# Patient Record
Sex: Male | Born: 1960 | Race: White | Hispanic: No | Marital: Married | Smoking: Never smoker
Health system: Southern US, Community
[De-identification: ages and names within clinical notes are randomized; demographics above are authoritative.]

## PROBLEM LIST (undated history)

## (undated) DIAGNOSIS — T7840XA Allergy, unspecified, initial encounter: Secondary | ICD-10-CM

## (undated) DIAGNOSIS — M1990 Unspecified osteoarthritis, unspecified site: Secondary | ICD-10-CM

## (undated) HISTORY — DX: Unspecified osteoarthritis, unspecified site: M19.90

## (undated) HISTORY — DX: Allergy, unspecified, initial encounter: T78.40XA

## (undated) HISTORY — PX: CHOLECYSTECTOMY: SHX55

## (undated) HISTORY — PX: MENISCUS REPAIR: SHX5179

---

## 2014-12-22 ENCOUNTER — Ambulatory Visit (INDEPENDENT_AMBULATORY_CARE_PROVIDER_SITE_OTHER): Admitting: Family Medicine

## 2014-12-22 ENCOUNTER — Ambulatory Visit

## 2014-12-22 VITALS — BP 128/76 | HR 62 | Temp 98.5°F | Resp 16 | Ht 70.0 in | Wt 329.0 lb

## 2014-12-22 DIAGNOSIS — S50311A Abrasion of right elbow, initial encounter: Secondary | ICD-10-CM

## 2014-12-22 DIAGNOSIS — S80211A Abrasion, right knee, initial encounter: Secondary | ICD-10-CM

## 2014-12-22 DIAGNOSIS — S5001XA Contusion of right elbow, initial encounter: Secondary | ICD-10-CM

## 2014-12-22 DIAGNOSIS — S8001XA Contusion of right knee, initial encounter: Secondary | ICD-10-CM

## 2014-12-22 MED ORDER — HYDROCODONE-ACETAMINOPHEN 5-325 MG PO TABS: 1.0000 | ORAL_TABLET | Freq: Four times a day (QID) | ORAL | Status: AC | PRN

## 2014-12-22 MED ORDER — DICLOFENAC SODIUM 75 MG PO TBEC: 75.0000 mg | DELAYED_RELEASE_TABLET | Freq: Two times a day (BID) | ORAL | Status: AC

## 2014-12-22 NOTE — Progress Notes (Addendum)
Subjective:  This chart was scribed for Elvina SidleKurt Lauenstein, MD by Broadus Johnawaa Al Rifaie, Medical Scribe. This patient was seen in Room 1 and the patient's care was started at 5:41 PM.   Patient ID: Colin Ferrell, male    DOB: Feb 06, 1961, 54 y.o.   MRN: [id number]  Chief Complaint  Patient presents with   Knee Injury    right, x this morning, Workers Comp    Joint Swelling    right elbow, bump, Workers Comp     HPI HPI Comments: Colin Ferrell is a 54 y.o. male who presents to [facility] who is a W/C pt complaining of a right knee injury, onset this morning. Pt states that he was walking, stepped on an object and fell down that caused an abrasion on the knee and on the right elbow. His symptoms are also accompanied with mild swelling of both joints. Pt notes that he was able to walk with a mild limp, and indicates that he did feel a pop of the knee after initial onset. 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 Pt denies hitting the head.    There are no active problems to display for this patient.  Past Medical History  Diagnosis Date   Allergy    Arthritis    Past Surgical History  Procedure Laterality Date   Cholecystectomy     Meniscus repair      rt. knee   Allergies  Allergen Reactions   Codeine     Stomach ache    Prior to Admission medications   Not on File   Social History   Social History   Marital Status: Married    Spouse Name: N/A   Number of Children: N/A   Years of Education: N/A   Occupational History   Not on file.   Social History Main Topics   Smoking status: Never Smoker    Smokeless tobacco: Never Used   Alcohol Use: No   Drug Use: No   Sexual Activity: Not on file   Other Topics Concern   Not on file   Social History Narrative   No narrative on file    Review of Systems  Musculoskeletal: Positive for joint swelling and arthralgias.  Skin: Positive for wound.       Objective:   Physical Exam  Constitutional: He is oriented to person, place, and time. He appears well-developed and well-nourished. No distress.  HENT:  Head: Normocephalic and atraumatic.  Eyes: EOM are normal. Pupils are equal, round, and reactive to light.  Neck: Neck supple.  Cardiovascular: Normal rate.   Pulmonary/Chest: Effort normal.  Musculoskeletal:  Right knee shows an abrasion over the lower patella, full range of motion, mild effusion, no ecchymosis, negative Lachman  Neurological: He is alert and oriented to person, place, and time. No cranial nerve deficit.  Skin: Skin is warm and dry.  Psychiatric: He has a normal mood and affect. His behavior is normal.  Nursing note and vitals reviewed.  [facility] reading (PRIMARY) by  Dr. Milus GlazierLauenstein:  Right knee. No acute bony abnormality although there is symptoms   BP 128/76 mmHg   Pulse 62   Temp(Src) 98.5 F (36.9 C) (Oral)   Resp 16   Ht 5\' 10"  (1.778 m)   Wt 329 lb (149.233 kg)   BMI 47.21 kg/m2   SpO2 98%     Assessment & Plan:    By  signing my name below, I, Rawaa Al Rifaie, attest that this documentation has been prepared under the direction and in the presence of Elvina Sidle, MD.  Watt Climes Rifaie, Medical Scribe. 12/22/2014.  5:49 PM.  This chart was scribed in my presence and reviewed by me personally.    ICD-9-CM ICD-10-CM   1. Knee contusion, right, initial encounter 924.11 S80.01XA DG Knee Complete 4 Views Right     HYDROcodone-acetaminophen (NORCO) 5-325 MG tablet     diclofenac (VOLTAREN) 75 MG EC tablet  2. Knee abrasion, right, initial encounter 916.0 S80.211A DG Knee Complete 4 Views Right     HYDROcodone-acetaminophen (NORCO) 5-325 MG tablet     diclofenac (VOLTAREN) 75 MG EC tablet  3. Elbow abrasion, right, initial encounter 913.0 S50.311A HYDROcodone-acetaminophen (NORCO) 5-325 MG tablet     diclofenac (VOLTAREN) 75 MG EC tablet  4. Elbow contusion, right, initial encounter 923.11 S50.01XA  HYDROcodone-acetaminophen (NORCO) 5-325 MG tablet     diclofenac (VOLTAREN) 75 MG EC tablet   Recheck on Sunday  Signed, Elvina Sidle, MD   This chart was scribed in my presence and reviewed by me personally.    ICD-9-CM ICD-10-CM   1. Knee contusion, right, initial encounter 924.11 S80.01XA DG Knee Complete 4 Views Right  2. Knee abrasion, right, initial encounter 916.0 S80.211A DG Knee Complete 4 Views Right  3. Elbow abrasion, right, initial encounter 913.0 S50.311A   4. Elbow contusion, right, initial encounter 923.11 S50.01XA      Signed, Elvina Sidle, MD

## 2014-12-25 ENCOUNTER — Ambulatory Visit (INDEPENDENT_AMBULATORY_CARE_PROVIDER_SITE_OTHER): Admitting: Family Medicine

## 2014-12-25 VITALS — BP 110/70 | HR 53 | Temp 98.1°F | Resp 20 | Ht 70.0 in | Wt 332.0 lb

## 2014-12-25 DIAGNOSIS — S8001XD Contusion of right knee, subsequent encounter: Secondary | ICD-10-CM

## 2014-12-25 DIAGNOSIS — S5001XD Contusion of right elbow, subsequent encounter: Secondary | ICD-10-CM

## 2014-12-25 NOTE — Patient Instructions (Signed)
Return Thursday between 8 and 4 PM for recheck

## 2014-12-25 NOTE — Progress Notes (Signed)
 @[facility] @  This chart was scribed for Elvina SidleKurt Lauenstein, MD by Andrew Auaven Small, ED Scribe. This patient was seen in room 4 and the patient's care was started at 8:44 AM.  Patient ID: Colin Ferrell MRN: [id number], DOB: 1960/08/09, 54 y.o. Date of Encounter: 12/25/2014, 8:39 AM  Primary Physician: Bishop DublinURNER, MIRANDA JOCELYN, MD  Chief Complaint:  Chief Complaint  Patient presents with   Follow-up    Knee injury (workers compensation)    HPI: 54 y.o. year old male with history below presents with for a workers compensation follow up. Pt was seen here 3 days ago for a fall that occurred at work injuring his right knee and right elbow. Since last visit, he still has soreness and swelling to right knee with bruising. He is able to walk but has soreness when doing this. He has popping in right knee when sitting for 15+ minutes. No complaints with elbow. 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   Past Medical History  Diagnosis Date   Allergy    Arthritis      Home Meds: Prior to Admission medications   Medication Sig Start Date End Date Taking? Authorizing Provider  diclofenac (VOLTAREN) 75 MG EC tablet Take 1 tablet (75 mg total) by mouth 2 (two) times daily. Patient not taking: Reported on 12/25/2014 12/22/14   Elvina SidleKurt Lauenstein, MD  HYDROcodone-acetaminophen [facility](NORCO) 5-325 MG tablet Take 1 tablet by mouth every 6 (six) hours as needed for moderate pain. Patient not taking: Reported on 12/25/2014 12/22/14   Elvina SidleKurt Lauenstein, MD    Allergies:  Allergies  Allergen Reactions   Codeine     Stomach ache     Social History   Social History   Marital Status: Married    Spouse Name: N/A   Number of Children: N/A   Years of Education: N/A   Occupational History   Not on file.   Social History Main Topics   Smoking status: Never Smoker    Smokeless tobacco: Never Used   Alcohol Use: No   Drug Use: No   Sexual Activity: Not on file   Other Topics Concern    Not on file   Social History Narrative     Review of Systems: Constitutional: negative for chills, fever, night sweats, weight changes, or fatigue  HEENT: negative for vision changes, hearing loss, congestion, rhinorrhea, ST, epistaxis, or sinus pressure Cardiovascular: negative for chest pain or palpitations Respiratory: negative for hemoptysis, wheezing, shortness of breath, or cough Abdominal: negative for abdominal pain, nausea, vomiting, diarrhea, or constipation Dermatological: negative for rash Neurologic: negative for headache, dizziness, or syncope All other systems reviewed and are otherwise negative with the exception to those above and in the HPI.   Physical Exam: Blood pressure 110/70, pulse 53, temperature 98.1 F (36.7 C), temperature source Oral, resp. rate 20, height 5\' 10"  (1.778 m), weight 332 lb (150.594 kg), SpO2 98 %., Body mass index is 47.64 kg/(m^2). General: Well developed, well nourished, in no acute distress. Head: Normocephalic, atraumatic, eyes without discharge, sclera non-icteric, nares are without discharge. Bilateral auditory canals clear, TM's are without perforation, pearly grey and translucent with reflective cone of light bilaterally. Oral cavity moist, posterior pharynx without exudate, erythema, peritonsillar abscess, or post nasal drip.  Neck: Supple. No thyromegaly. Full ROM. No lymphadenopathy. Lungs: Clear bilaterally to auscultation without wheezes, rales, or rhonchi. Breathing is unlabored. Heart: RRR with S1 S2. No murmurs, rubs, or gallops appreciated. Abdomen: Soft, non-tender, non-distended  with normoactive bowel sounds. No hepatomegaly. No rebound/guarding. No obvious abdominal masses. Msk:  Strength and tone normal for age. Right knee- Mild swelling, healing abrasion right knee. Full ROM.  Mild crepitous Extremities/Skin: Warm and dry. No clubbing or cyanosis. No edema. No rashes or suspicious lesions. Neuro: Alert and oriented X 3.  Moves all extremities spontaneously. Gait is normal. CNII-XII grossly in tact. Psych:  Responds to questions appropriately with a normal affect.     ASSESSMENT AND PLAN:  54 y.o. year old male with  This chart was scribed in my presence and reviewed by me personally.    ICD-9-CM ICD-10-CM   1. Elbow contusion, right, subsequent encounter V58.89 S50.01XD    923.11    2. Knee contusion, right, subsequent encounter V58.89 S80.01XD    924.11       By signing my name below, I, Raven Small, attest that this documentation has been prepared under the direction and in the presence of Elvina Sidle, MD.  Electronically Signed: Andrew Au, ED Scribe. 12/25/2014. 8:51 AM.  Signed, Elvina Sidle, MD 12/25/2014 8:39 AM

## 2014-12-29 ENCOUNTER — Ambulatory Visit (INDEPENDENT_AMBULATORY_CARE_PROVIDER_SITE_OTHER): Admitting: Family Medicine

## 2014-12-29 VITALS — BP 126/82 | HR 73 | Temp 98.4°F | Resp 18 | Ht 70.0 in | Wt 330.8 lb

## 2014-12-29 DIAGNOSIS — S8001XA Contusion of right knee, initial encounter: Secondary | ICD-10-CM | POA: Diagnosis not present

## 2014-12-29 NOTE — Progress Notes (Signed)
This chart was scribed for Elvina SidleKurt Anoop Hemmer, MD by Stann Oresung-Kai Tsai, medical scribe at [facility].The patient was seen in exam room 2 and the patient's care was started at 2:25 PM.  Patient ID: Colin BachelorMark Ferrell MRN: [id number], DOB: Jul 10, 1960, 54 y.o. Date of Encounter: 12/29/2014  Primary Physician: Bishop DublinURNER, MIRANDA JOCELYN, MD  Chief Complaint:  Chief Complaint  Patient presents with  . Follow-up    w/c follow up    HPI:  Colin BachelorMark Berkland is a 54 y.o. male who presents to [facility] for workers comp follow up.  He initially injured his right knee while at work 7 days ago. He stepped on an object and fell down that caused an abrasion on the knee and the right elbow.   His right knee still have some pain. He feels some pain in his left knee but he believes it is due to compensation. The abrasion on his right knee is mostly healed. His right elbow is fine now. He walks about 2 miles a day. He still notices some popping in his right knee. He notes that his right leg was about to give out last night.   He works for the [employer].   Allergies:  Allergies  Allergen Reactions  . Codeine     Stomach ache    Review of Systems: Constitutional: negative for fever, chills, night sweats, weight changes, or fatigue  HEENT: negative for vision changes, hearing loss, congestion, rhinorrhea, ST, epistaxis, or sinus pressure Cardiovascular: negative for chest pain or palpitations Respiratory: negative for hemoptysis, wheezing, shortness of breath, or cough Abdominal: negative for abdominal pain, nausea, vomiting, diarrhea, or constipation Dermatological: negative for rash Neurologic: negative for headache, dizziness, or syncope Musc: positive for myalgia (right knee, right elbow) All other systems reviewed and are otherwise negative with the exception to those above and in the HPI.  Physical Exam: Blood pressure 126/82, pulse 73, temperature 98.4 F (36.9 C),  temperature source Oral, resp. rate 18, height 5\' 10"  (1.778 m), weight 330 lb 12.8 oz (150.05 kg), SpO2 98 %., Body mass index is 47.46 kg/(m^2). General: Well developed, well nourished, in no acute distress. Head: Normocephalic, atraumatic, eyes without discharge, sclera non-icteric, nares are without discharge. Bilateral auditory canals clear, TM's are without perforation, pearly grey and translucent with reflective cone of light bilaterally. Oral cavity moist, posterior pharynx without exudate, erythema, peritonsillar abscess, or post nasal drip.  Neck: Supple. No thyromegaly. Full ROM. No lymphadenopathy. Lungs: Clear bilaterally to auscultation without wheezes, rales, or rhonchi. Breathing is unlabored. Heart: RRR with S1 S2. No murmurs, rubs, or gallops appreciated. Abdomen: Soft, non-tender, non-distended with normoactive bowel sounds. No hepatomegaly. No rebound/guarding. No obvious abdominal masses. Msk:  Strength and tone normal for age. Extremities/Skin: Warm and dry. No clubbing or cyanosis. Right knee with mild swelling, healing abrasion, full ROM, but with mild crepitous Neuro: Alert and oriented X 3. Moves all extremities spontaneously. Gait is normal. CNII-XII grossly in tact. Psych:  Responds to questions appropriately with a normal affect.    ASSESSMENT AND PLAN:  54 y.o. year old male with This chart was scribed in my presence and reviewed by me personally. This chart was scribed in my presence and reviewed by me personally.    ICD-9-CM ICD-10-CM   1. Knee contusion, right, initial encounter 924.11 S80.01XA      By signing my name below, I, Stann Oresung-Kai Tsai, attest that this documentation has been prepared under the direction and in  the presence of Elvina Sidle, MD. Electronically Signed: Stann Ore, Scribe. 12/29/2014 , 2:35 PM .  Signed, Elvina Sidle, MD 12/29/2014 2:35 PM

## 2016-07-04 ENCOUNTER — Other Ambulatory Visit: Payer: Self-pay | Admitting: Emergency Medicine

## 2016-07-04 ENCOUNTER — Ambulatory Visit (INDEPENDENT_AMBULATORY_CARE_PROVIDER_SITE_OTHER): Payer: Self-pay

## 2016-07-04 DIAGNOSIS — M25511 Pain in right shoulder: Secondary | ICD-10-CM

## 2016-07-04 DIAGNOSIS — M25551 Pain in right hip: Secondary | ICD-10-CM

## 2016-07-04 DIAGNOSIS — W19XXXA Unspecified fall, initial encounter: Secondary | ICD-10-CM

## 2016-07-04 DIAGNOSIS — M5136 Other intervertebral disc degeneration, lumbar region: Secondary | ICD-10-CM

## 2016-07-04 DIAGNOSIS — M25561 Pain in right knee: Secondary | ICD-10-CM

## 2016-11-14 IMAGING — CR DG KNEE COMPLETE 4+V*R*
5 series · 5 of 5 positions shown · non-contrast
Comparison: None.

CLINICAL DATA: 54-year-old male with knee contusion and pain

EXAM:
RIGHT KNEE - COMPLETE 4+ VIEW

[AP]
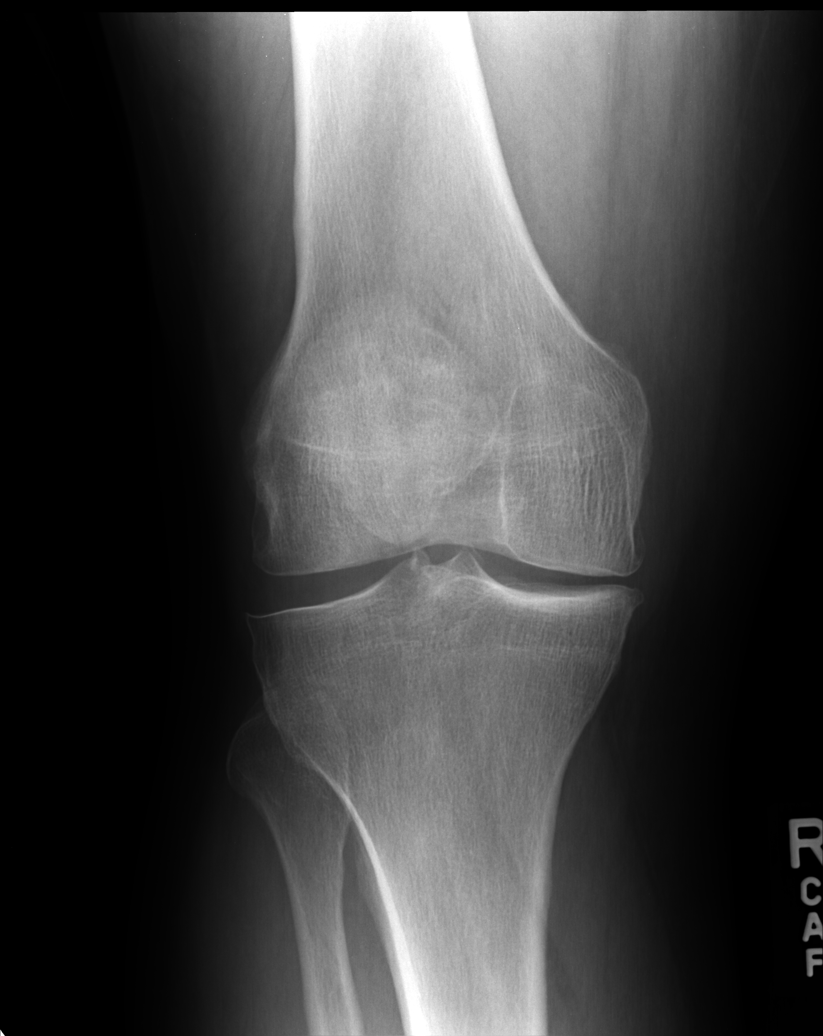

[lateral]
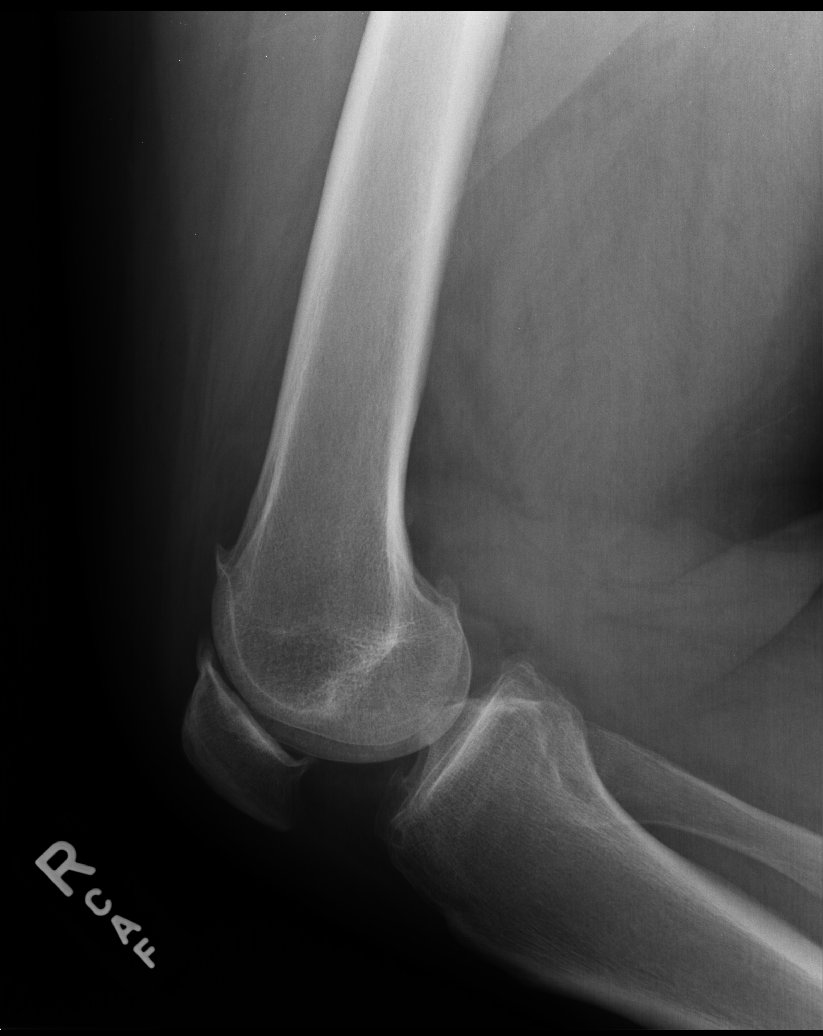

[ap ext rot]
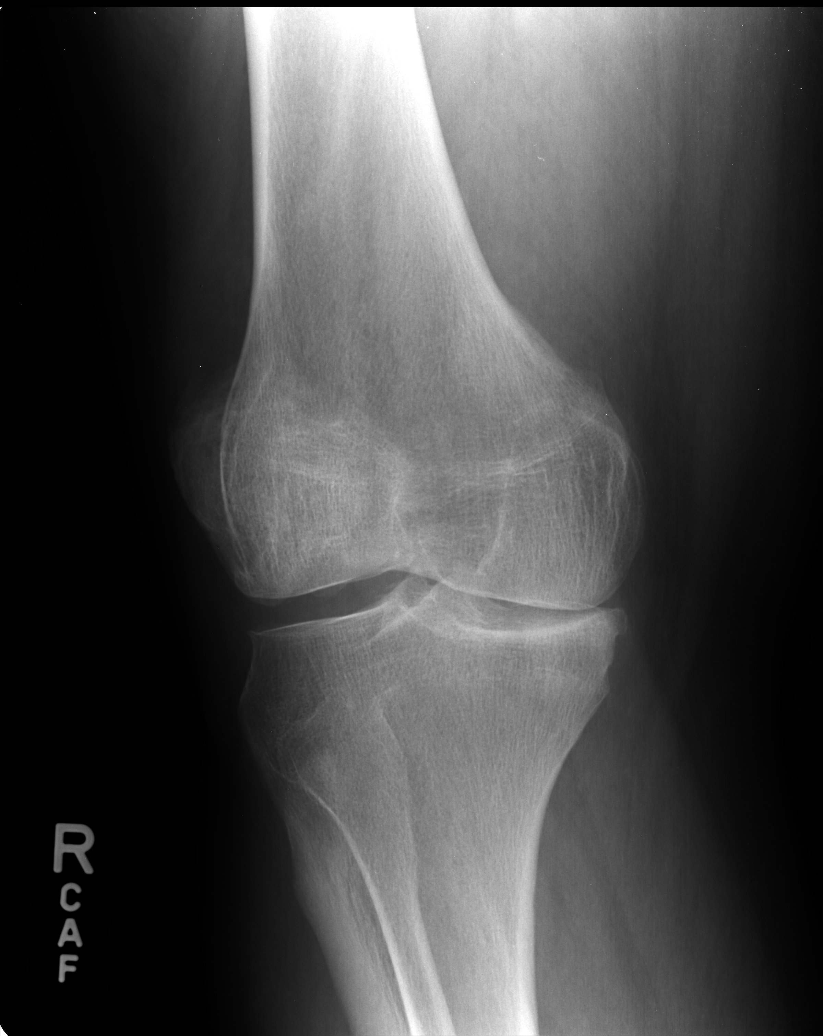

[ap int rot]
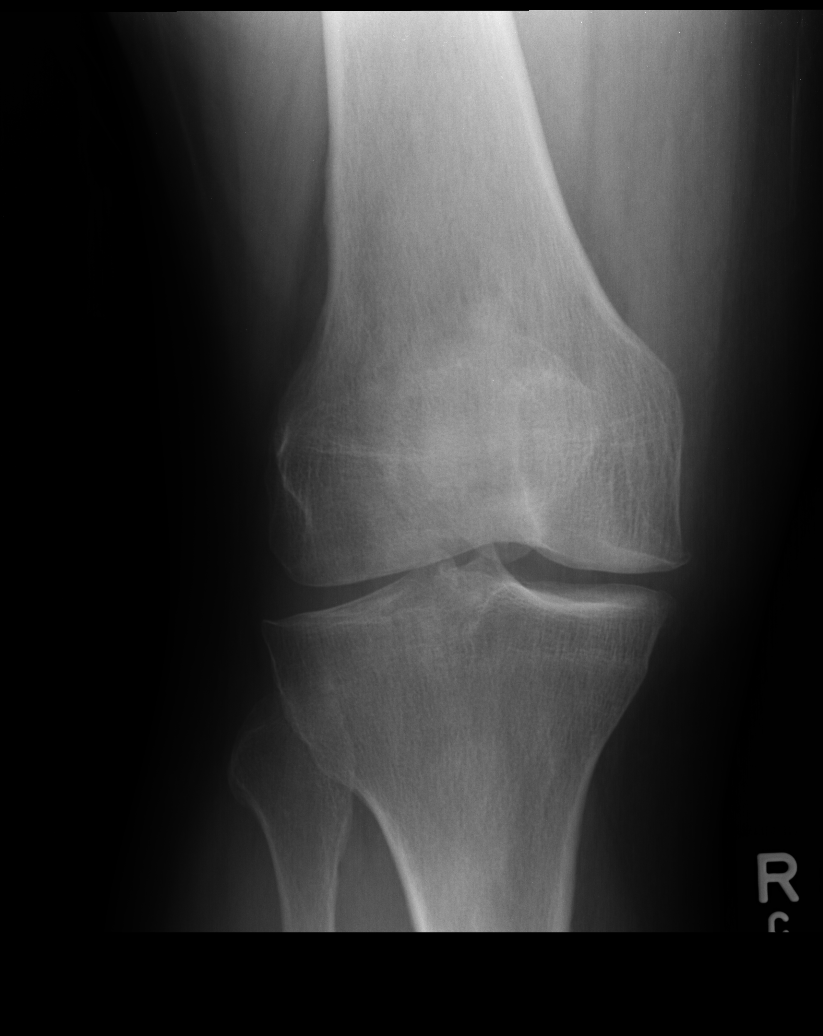

[sunrise]
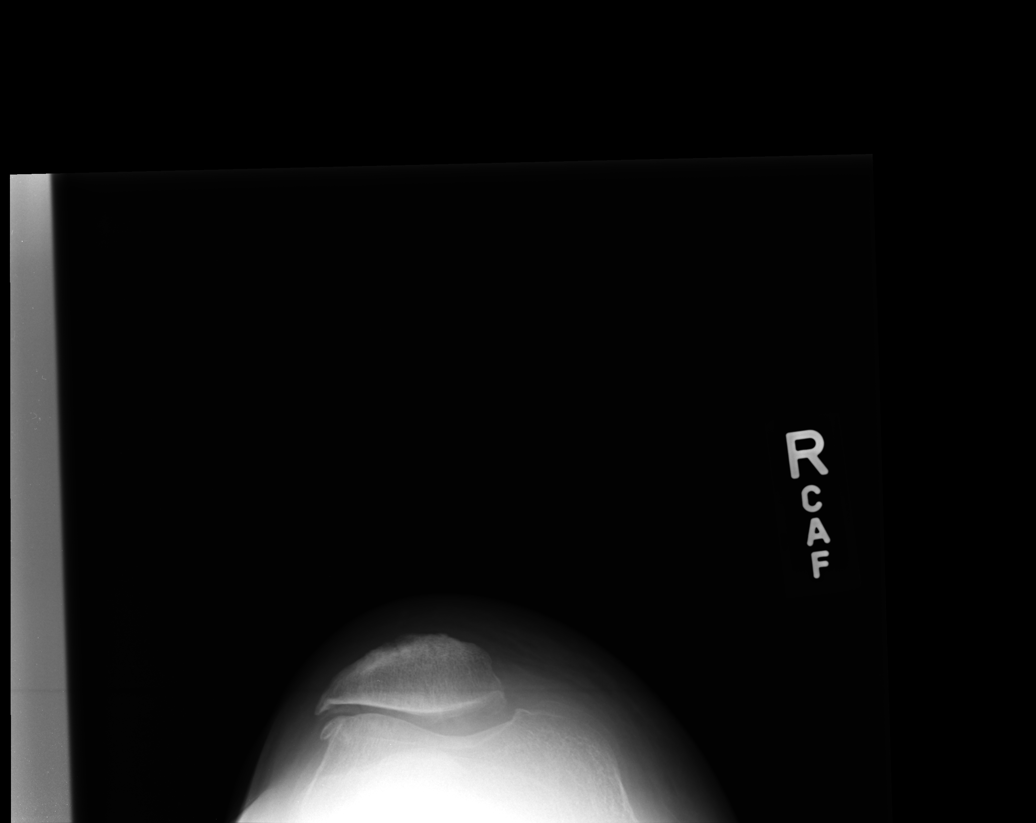

[5 of 5 positions shown; findings below may reference images not displayed]

FINDINGS: No evidence of acute fracture, malalignment or knee joint effusion.
Tricompartmental degenerative osteoarthritis most significant in the
patellofemoral and medial compartments. Normal bony mineralization.
No lytic or blastic osseous lesion. Unremarkable soft tissues.
IMPRESSION: 1. No acute bony abnormality or knee joint effusion.
2. Tricompartmental osteoarthritis most significant in the
patellofemoral and medial compartments.

## 2016-12-27 ENCOUNTER — Other Ambulatory Visit: Payer: Self-pay | Admitting: Orthopedic Surgery

## 2017-01-01 ENCOUNTER — Other Ambulatory Visit: Payer: Self-pay | Admitting: Orthopedic Surgery

## 2017-01-01 DIAGNOSIS — M25561 Pain in right knee: Secondary | ICD-10-CM

## 2017-01-11 ENCOUNTER — Inpatient Hospital Stay
Admission: RE | Admit: 2017-01-11 | Discharge: 2017-01-11 | Disposition: A | Discharge status: Home / Self Care | Payer: Self-pay | Source: Ambulatory Visit | Attending: Orthopedic Surgery | Admitting: Orthopedic Surgery

## 2017-01-11 ENCOUNTER — Other Ambulatory Visit: Payer: Self-pay

## 2019-12-15 ENCOUNTER — Other Ambulatory Visit: Payer: Self-pay

## 2019-12-15 DIAGNOSIS — M25561 Pain in right knee: Secondary | ICD-10-CM

## 2019-12-30 ENCOUNTER — Other Ambulatory Visit: Payer: Self-pay

## 2020-01-03 ENCOUNTER — Other Ambulatory Visit: Payer: Self-pay

## 2020-01-27 ENCOUNTER — Other Ambulatory Visit: Payer: Self-pay

## 2020-02-14 ENCOUNTER — Ambulatory Visit
Admission: RE | Admit: 2020-02-14 | Discharge: 2020-02-14 | Disposition: A | Discharge status: Home / Self Care | Source: Ambulatory Visit | Attending: Orthopedic Surgery | Admitting: Orthopedic Surgery

## 2020-02-14 ENCOUNTER — Other Ambulatory Visit: Payer: Self-pay

## 2020-02-14 DIAGNOSIS — M25561 Pain in right knee: Secondary | ICD-10-CM
